# Patient Record
Sex: Male | Born: 1995 | Race: Black or African American | Hispanic: No | Marital: Single | Smoking: Never smoker
Health system: Southern US, Community
[De-identification: ages and names within clinical notes are randomized; demographics above are authoritative.]

## PROBLEM LIST (undated history)

## (undated) DIAGNOSIS — J45909 Unspecified asthma, uncomplicated: Secondary | ICD-10-CM

---
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
## 2018-05-29 ENCOUNTER — Emergency Department (HOSPITAL_COMMUNITY)
Admission: EM | Admit: 2018-05-29 | Discharge: 2018-05-30 | Disposition: A | Discharge status: Home / Self Care | Payer: BLUE CROSS/BLUE SHIELD | Attending: Emergency Medicine | Admitting: Emergency Medicine

## 2018-05-29 ENCOUNTER — Emergency Department (HOSPITAL_COMMUNITY): Payer: BLUE CROSS/BLUE SHIELD

## 2018-05-29 ENCOUNTER — Emergency Department (HOSPITAL_COMMUNITY): Admission: EM | Admit: 2018-05-29 | Discharge: 2018-05-29 | Discharge status: Absent without leave | Payer: Self-pay

## 2018-05-29 DIAGNOSIS — Y9389 Activity, other specified: Secondary | ICD-10-CM | POA: Insufficient documentation

## 2018-05-29 DIAGNOSIS — S12601A Unspecified nondisplaced fracture of seventh cervical vertebra, initial encounter for closed fracture: Secondary | ICD-10-CM | POA: Insufficient documentation

## 2018-05-29 DIAGNOSIS — Z23 Encounter for immunization: Secondary | ICD-10-CM | POA: Diagnosis not present

## 2018-05-29 DIAGNOSIS — S20311A Abrasion of right front wall of thorax, initial encounter: Secondary | ICD-10-CM | POA: Insufficient documentation

## 2018-05-29 DIAGNOSIS — S0102XA Laceration with foreign body of scalp, initial encounter: Secondary | ICD-10-CM | POA: Insufficient documentation

## 2018-05-29 DIAGNOSIS — S098XXA Other specified injuries of head, initial encounter: Secondary | ICD-10-CM | POA: Diagnosis present

## 2018-05-29 DIAGNOSIS — Y999 Unspecified external cause status: Secondary | ICD-10-CM | POA: Diagnosis not present

## 2018-05-29 DIAGNOSIS — Y9289 Other specified places as the place of occurrence of the external cause: Secondary | ICD-10-CM | POA: Insufficient documentation

## 2018-05-29 DIAGNOSIS — S0101XA Laceration without foreign body of scalp, initial encounter: Secondary | ICD-10-CM

## 2018-05-29 LAB — BASIC METABOLIC PANEL
Anion gap: 11 (ref 5–15)
BUN: 9 mg/dL (ref 6–20)
CO2: 24 mmol/L (ref 22–32)
Calcium: 9.4 mg/dL (ref 8.9–10.3)
Chloride: 102 mmol/L (ref 101–111)
Creatinine, Ser: 0.93 mg/dL (ref 0.61–1.24)
Glucose, Bld: 93 mg/dL (ref 65–99)
POTASSIUM: 3.8 mmol/L (ref 3.5–5.1)
SODIUM: 137 mmol/L (ref 135–145)

## 2018-05-29 LAB — CBC WITH DIFFERENTIAL/PLATELET
BASOS ABS: 0.1 10*3/uL (ref 0.0–0.1)
Basophils Relative: 1 %
Eosinophils Absolute: 0 10*3/uL (ref 0.0–0.7)
Eosinophils Relative: 0 %
HEMATOCRIT: 48.1 % (ref 39.0–52.0)
HEMOGLOBIN: 15.3 g/dL (ref 13.0–17.0)
Lymphocytes Relative: 28 %
Lymphs Abs: 2.5 10*3/uL (ref 0.7–4.0)
MCH: 22.4 pg — ABNORMAL LOW (ref 26.0–34.0)
MCHC: 31.8 g/dL (ref 30.0–36.0)
MCV: 70.5 fL — ABNORMAL LOW (ref 78.0–100.0)
MONOS PCT: 9 %
Monocytes Absolute: 0.8 10*3/uL (ref 0.1–1.0)
Neutro Abs: 5.5 10*3/uL (ref 1.7–7.7)
Neutrophils Relative %: 62 %
Platelets: 262 10*3/uL (ref 150–400)
RBC: 6.82 MIL/uL — AB (ref 4.22–5.81)
RDW: 15.7 % — AB (ref 11.5–15.5)
WBC: 8.9 10*3/uL (ref 4.0–10.5)

## 2018-05-29 LAB — PROTIME-INR
INR: 1.04
Prothrombin Time: 13.5 seconds (ref 11.4–15.2)

## 2018-05-29 MED ORDER — MORPHINE SULFATE 15 MG PO TABS
15.0000 mg | ORAL_TABLET | Freq: Once | ORAL | Status: AC
  Administered 2018-05-30: 15 mg via ORAL
  Filled 2018-05-29: qty 1

## 2018-05-29 MED ORDER — TETANUS-DIPHTH-ACELL PERTUSSIS 5-2.5-18.5 LF-MCG/0.5 IM SUSP
0.5000 mL | Freq: Once | INTRAMUSCULAR | Status: AC
  Administered 2018-05-29: 0.5 mL via INTRAMUSCULAR
  Filled 2018-05-29: qty 0.5

## 2018-05-29 MED ORDER — MORPHINE SULFATE 30 MG PO TABS: 30.0000 mg | ORAL_TABLET | ORAL | 0 refills | Status: AC | PRN

## 2018-05-29 MED ORDER — LIDOCAINE HCL (PF) 1 % IJ SOLN
30.0000 mL | Freq: Once | INTRAMUSCULAR | Status: AC
  Administered 2018-05-29: 30 mL via INTRADERMAL
  Filled 2018-05-29: qty 30

## 2018-05-29 MED ORDER — SODIUM CHLORIDE 0.9 % IV BOLUS
1000.0000 mL | Freq: Once | INTRAVENOUS | Status: AC
  Administered 2018-05-29: 1000 mL via INTRAVENOUS

## 2018-05-29 MED ORDER — FENTANYL CITRATE (PF) 100 MCG/2ML IJ SOLN
50.0000 ug | Freq: Once | INTRAMUSCULAR | Status: AC
  Administered 2018-05-29: 50 ug via INTRAVENOUS
  Filled 2018-05-29: qty 2

## 2018-05-29 MED ORDER — IOHEXOL 300 MG/ML  SOLN
100.0000 mL | Freq: Once | INTRAMUSCULAR | Status: AC | PRN
  Administered 2018-05-29: 100 mL via INTRAVENOUS
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
## 2018-05-29 NOTE — ED Notes (Signed)
Pt's mom at bedside.
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
## 2018-05-29 NOTE — ED Notes (Signed)
MD in to close scalp laceration

## 2018-05-30 ENCOUNTER — Encounter (HOSPITAL_COMMUNITY): Payer: Self-pay | Admitting: Emergency Medicine

## 2018-05-30 NOTE — ED Notes (Signed)
Hard C collar removed and Aspen collar applied while traction being held by Rennis Goldenori Phillips, RN  Pt tolerated well

## 2018-05-31 ENCOUNTER — Other Ambulatory Visit: Payer: Self-pay

## 2018-05-31 ENCOUNTER — Emergency Department (HOSPITAL_COMMUNITY)
Admission: EM | Admit: 2018-05-31 | Discharge: 2018-05-31 | Disposition: A | Discharge status: Home / Self Care | Payer: BLUE CROSS/BLUE SHIELD | Attending: Emergency Medicine | Admitting: Emergency Medicine

## 2018-05-31 ENCOUNTER — Encounter (HOSPITAL_COMMUNITY): Payer: Self-pay | Admitting: Emergency Medicine

## 2018-05-31 DIAGNOSIS — Z4801 Encounter for change or removal of surgical wound dressing: Secondary | ICD-10-CM | POA: Insufficient documentation

## 2018-05-31 DIAGNOSIS — Z5189 Encounter for other specified aftercare: Secondary | ICD-10-CM

## 2018-05-31 HISTORY — DX: Unspecified asthma, uncomplicated: J45.909
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
## 2018-06-08 ENCOUNTER — Emergency Department (HOSPITAL_COMMUNITY)
Admission: EM | Admit: 2018-06-08 | Discharge: 2018-06-08 | Disposition: A | Discharge status: Home / Self Care | Payer: BLUE CROSS/BLUE SHIELD | Attending: Emergency Medicine | Admitting: Emergency Medicine

## 2018-06-08 ENCOUNTER — Other Ambulatory Visit: Payer: Self-pay

## 2018-06-08 ENCOUNTER — Encounter (HOSPITAL_COMMUNITY): Payer: Self-pay | Admitting: Emergency Medicine

## 2018-06-08 DIAGNOSIS — J45909 Unspecified asthma, uncomplicated: Secondary | ICD-10-CM | POA: Insufficient documentation

## 2018-06-08 DIAGNOSIS — S0102XD Laceration with foreign body of scalp, subsequent encounter: Secondary | ICD-10-CM | POA: Diagnosis not present

## 2018-06-08 DIAGNOSIS — Z4802 Encounter for removal of sutures: Secondary | ICD-10-CM | POA: Insufficient documentation

## 2018-06-08 DIAGNOSIS — L089 Local infection of the skin and subcutaneous tissue, unspecified: Secondary | ICD-10-CM | POA: Diagnosis not present

## 2018-06-08 DIAGNOSIS — M542 Cervicalgia: Secondary | ICD-10-CM | POA: Insufficient documentation

## 2018-06-08 DIAGNOSIS — T148XXA Other injury of unspecified body region, initial encounter: Secondary | ICD-10-CM

## 2018-06-08 DIAGNOSIS — R220 Localized swelling, mass and lump, head: Secondary | ICD-10-CM | POA: Diagnosis present

## 2018-06-08 MED ORDER — ACETAMINOPHEN 500 MG PO TABS: 1000.0000 mg | ORAL_TABLET | Freq: Four times a day (QID) | ORAL | 0 refills | Status: AC | PRN

## 2018-06-08 MED ORDER — DOXYCYCLINE HYCLATE 100 MG PO CAPS: 100.0000 mg | ORAL_CAPSULE | Freq: Two times a day (BID) | ORAL | 0 refills | Status: DC

## 2018-06-08 MED ORDER — OXYCODONE-ACETAMINOPHEN 5-325 MG PO TABS: 1.0000 | ORAL_TABLET | ORAL | 0 refills | Status: DC | PRN

## 2018-06-08 MED ORDER — NAPROXEN 500 MG PO TABS: 500.0000 mg | ORAL_TABLET | Freq: Two times a day (BID) | ORAL | 0 refills | Status: AC

## 2018-06-08 MED ORDER — DOXYCYCLINE HYCLATE 100 MG PO CAPS: 100.0000 mg | ORAL_CAPSULE | Freq: Two times a day (BID) | ORAL | 0 refills | Status: AC
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
## 2018-06-08 NOTE — Discharge Instructions (Addendum)
You were seen in the ER for staple removal. 20 staples were removed.  Your wound is infected. Take doxycycline as prescribed. Keep wound clean with clean water and soap, or saline.   For neck pain, take 1000 mg acetaminophen (tylenol) plus 500 mg naproxen every 8 hours for the next 3 days.  Follow up with Dr Barbette MerinoJensen on Tuesday as scheduled for re-evaluation of neck injury and scalp wound check.   Return to the ER for fevers, chills, worsening swelling or pus from scalp wound

## 2018-06-08 NOTE — ED Notes (Signed)
Patient here to have staples checked in scalp, in place x 1 week, concerned with ongoing swelling and drainage.

## 2018-06-08 NOTE — ED Notes (Signed)
Pt verbalized understanding discharge instructions and denies any further needs or questions at this time. VS stable, ambulatory and steady gait.   
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
## 2018-06-20 ENCOUNTER — Emergency Department (HOSPITAL_COMMUNITY)
Admission: EM | Admit: 2018-06-20 | Discharge: 2018-06-20 | Disposition: A | Discharge status: Home / Self Care | Payer: BLUE CROSS/BLUE SHIELD | Attending: Emergency Medicine | Admitting: Emergency Medicine

## 2018-06-20 ENCOUNTER — Other Ambulatory Visit: Payer: Self-pay

## 2018-06-20 ENCOUNTER — Encounter (HOSPITAL_COMMUNITY): Payer: Self-pay | Admitting: *Deleted

## 2018-06-20 ENCOUNTER — Emergency Department (HOSPITAL_COMMUNITY): Payer: BLUE CROSS/BLUE SHIELD

## 2018-06-20 DIAGNOSIS — L089 Local infection of the skin and subcutaneous tissue, unspecified: Secondary | ICD-10-CM

## 2018-06-20 DIAGNOSIS — J45909 Unspecified asthma, uncomplicated: Secondary | ICD-10-CM | POA: Insufficient documentation

## 2018-06-20 DIAGNOSIS — T148XXA Other injury of unspecified body region, initial encounter: Secondary | ICD-10-CM

## 2018-06-20 DIAGNOSIS — S0101XD Laceration without foreign body of scalp, subsequent encounter: Secondary | ICD-10-CM | POA: Diagnosis not present

## 2018-06-20 DIAGNOSIS — S0990XD Unspecified injury of head, subsequent encounter: Secondary | ICD-10-CM | POA: Diagnosis present

## 2018-06-20 LAB — CBC WITH DIFFERENTIAL/PLATELET
ABS IMMATURE GRANULOCYTES: 0 10*3/uL (ref 0.0–0.1)
BASOS PCT: 1 %
Basophils Absolute: 0 10*3/uL (ref 0.0–0.1)
Eosinophils Absolute: 0.1 10*3/uL (ref 0.0–0.7)
Eosinophils Relative: 2 %
HCT: 45.2 % (ref 39.0–52.0)
Hemoglobin: 13.5 g/dL (ref 13.0–17.0)
Immature Granulocytes: 0 %
Lymphocytes Relative: 20 %
Lymphs Abs: 1.5 10*3/uL (ref 0.7–4.0)
MCH: 22.2 pg — AB (ref 26.0–34.0)
MCHC: 29.9 g/dL — ABNORMAL LOW (ref 30.0–36.0)
MCV: 74.2 fL — AB (ref 78.0–100.0)
MONO ABS: 0.6 10*3/uL (ref 0.1–1.0)
MONOS PCT: 8 %
NEUTROS ABS: 5.5 10*3/uL (ref 1.7–7.7)
NEUTROS PCT: 69 %
PLATELETS: 353 10*3/uL (ref 150–400)
RBC: 6.09 MIL/uL — ABNORMAL HIGH (ref 4.22–5.81)
RDW: 17.1 % — AB (ref 11.5–15.5)
WBC: 7.9 10*3/uL (ref 4.0–10.5)

## 2018-06-20 LAB — BASIC METABOLIC PANEL
ANION GAP: 9 (ref 5–15)
BUN: 7 mg/dL (ref 6–20)
CALCIUM: 9.3 mg/dL (ref 8.9–10.3)
CO2: 28 mmol/L (ref 22–32)
Chloride: 102 mmol/L (ref 98–111)
Creatinine, Ser: 0.85 mg/dL (ref 0.61–1.24)
GLUCOSE: 91 mg/dL (ref 70–99)
Potassium: 3.8 mmol/L (ref 3.5–5.1)
Sodium: 139 mmol/L (ref 135–145)

## 2018-06-20 MED ORDER — IOHEXOL 300 MG/ML  SOLN: 100.0000 mL | Freq: Once | INTRAMUSCULAR | Status: DC | PRN

## 2018-06-20 MED ORDER — IOHEXOL 300 MG/ML  SOLN
100.0000 mL | Freq: Once | INTRAMUSCULAR | Status: AC | PRN
  Administered 2018-06-20: 100 mL via INTRAVENOUS

## 2018-06-20 MED ORDER — SULFAMETHOXAZOLE-TRIMETHOPRIM 800-160 MG PO TABS
1.0000 | ORAL_TABLET | Freq: Once | ORAL | Status: AC
  Administered 2018-06-20: 1 via ORAL
  Filled 2018-06-20: qty 1

## 2018-06-20 MED ORDER — SULFAMETHOXAZOLE-TRIMETHOPRIM 800-160 MG PO TABS: 1.0000 | ORAL_TABLET | Freq: Two times a day (BID) | ORAL | 0 refills | Status: AC
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
## 2018-06-20 NOTE — ED Notes (Signed)
Pt alert and oriented x4. Skin warm and dry. Respirations equal and unlabored. Pt has some drainage from scalp on head. Wound closed, approximated with staples intact. Pt afebrile.
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
## 2018-06-20 NOTE — Discharge Instructions (Addendum)
Please take all of your antibiotics until finished!   You may develop abdominal discomfort or diarrhea from the antibiotic.  You may help offset this with probiotics which you can buy or get in yogurt. Do not eat  or take the probiotics until 2 hours after your antibiotic.   Keep the wound clean and dry.  Do not submerge the wound in water but you may shower.  Do not apply antibiotic ointment or occlusive dressing.  Follow-up with Dr. Lovell SheehanJenkins on an outpatient basis this coming week.   Return to the emergency department immediately for any concerning signs or symptoms develop such as fevers, worsening drainage, severe headaches or pain, vomiting, or breaking out into a cold sweat.  If your blood pressure (BP) was elevated on multiple readings during this visit above 130 for the top number or above 80 for the bottom number, please have this repeated by your primary care provider within one month. You can also check your blood pressure when you are out at a pharmacy or grocery store. Many have machines that will check your blood pressure.  If your blood pressure remains elevated, please follow-up with your PCP.

## 2018-06-20 NOTE — ED Notes (Signed)
PA handed RN wound cultural and requested she send it to the lab.

## 2018-06-23 LAB — AEROBIC CULTURE W GRAM STAIN (SUPERFICIAL SPECIMEN)

## 2018-06-23 LAB — AEROBIC CULTURE  (SUPERFICIAL SPECIMEN)

## 2018-06-24 ENCOUNTER — Telehealth: Payer: Self-pay | Admitting: *Deleted

## 2018-06-24 NOTE — Telephone Encounter (Signed)
Post ED Visit - Positive Culture Follow-up  Culture report reviewed by antimicrobial stewardship pharmacist:  []  Bruce Salazar, Pharm.D. []  Bruce Salazar, Pharm.D., BCPS AQ-ID []  Bruce Salazar, Pharm.D., BCPS []  Bruce Salazar, Pharm.D., BCPS []  Bruce Salazar, [place]., BCPS, AAHIVP []  Bruce Salazar, Pharm.D., BCPS, AAHIVP [x]  Bruce Salazar, PharmD, BCPS []  Bruce Salazar, PharmD, BCPS []  Bruce Salazar, PharmD, BCPS []  Bruce Salazar, PharmD  Positive wound culture Treated with Sulfamethoxazole-Trimethoprim, organism sensitive to the same and no further patient follow-up is required at this time.  Bruce Salazar, Bruce Salazar [facility] 06/24/2018, 9:40 AM

## 2018-06-25 ENCOUNTER — Ambulatory Visit: Admit: 2018-06-25 | Payer: BLUE CROSS/BLUE SHIELD | Admitting: Neurosurgery

## 2018-06-25 SURGERY — REPAIR, LACERATION, SCALP
Anesthesia: General
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
## 2020-01-06 ENCOUNTER — Ambulatory Visit: Payer: Self-pay | Attending: Internal Medicine

## 2020-01-06 DIAGNOSIS — Z20822 Contact with and (suspected) exposure to covid-19: Secondary | ICD-10-CM | POA: Insufficient documentation

## 2020-01-07 LAB — NOVEL CORONAVIRUS, NAA: SARS-CoV-2, NAA: NOT DETECTED
